# Patient Record
Sex: Female | Born: 1965
Health system: Southern US, Community
[De-identification: ages and names within clinical notes are randomized; demographics above are authoritative.]

## PROBLEM LIST (undated history)

## (undated) DIAGNOSIS — R51 Headache: Secondary | ICD-10-CM

## (undated) DIAGNOSIS — E039 Hypothyroidism, unspecified: Secondary | ICD-10-CM

## (undated) DIAGNOSIS — E7800 Pure hypercholesterolemia, unspecified: Secondary | ICD-10-CM

## (undated) DIAGNOSIS — K589 Irritable bowel syndrome without diarrhea: Secondary | ICD-10-CM

## (undated) DIAGNOSIS — G8929 Other chronic pain: Secondary | ICD-10-CM

## (undated) HISTORY — DX: Headache: R51

## (undated) HISTORY — DX: Irritable bowel syndrome without diarrhea: K58.9

## (undated) HISTORY — DX: Hypothyroidism, unspecified: E03.9

## (undated) HISTORY — DX: Other chronic pain: G89.29

## (undated) HISTORY — DX: Pure hypercholesterolemia, unspecified: E78.00

---

## 2015-10-14 HISTORY — PX: COLONOSCOPY: SHX174

## 2018-01-31 ENCOUNTER — Encounter: Payer: Self-pay | Admitting: Gastroenterology

## 2018-02-05 ENCOUNTER — Encounter: Payer: Self-pay | Admitting: Gastroenterology

## 2018-03-06 ENCOUNTER — Encounter: Payer: Self-pay | Admitting: Gastroenterology

## 2018-03-06 ENCOUNTER — Ambulatory Visit (INDEPENDENT_AMBULATORY_CARE_PROVIDER_SITE_OTHER): Payer: BLUE CROSS/BLUE SHIELD | Admitting: Gastroenterology

## 2018-03-06 VITALS — BP 106/76 | HR 80 | Ht 63.0 in | Wt 126.2 lb

## 2018-03-06 DIAGNOSIS — K589 Irritable bowel syndrome without diarrhea: Secondary | ICD-10-CM

## 2018-03-06 DIAGNOSIS — R1013 Epigastric pain: Secondary | ICD-10-CM

## 2018-03-06 DIAGNOSIS — R110 Nausea: Secondary | ICD-10-CM | POA: Diagnosis not present

## 2018-03-06 NOTE — Patient Instructions (Addendum)
If you are age 53 or older, your body mass index should be between 23-30. Your Body mass index is 22.36 kg/m. If this is out of the aforementioned range listed, please consider follow up with your Primary Care Provider.  If you are age 49 or younger, your body mass index should be between 19-25. Your Body mass index is 22.36 kg/m. If this is out of the aformentioned range listed, please consider follow up with your Primary Care Provider.    You have been scheduled for an abdominal ultrasound at [facility] (1st floor [address] ) on  03/07/18 at 10:30 am. Please arrive 15 minutes prior to your appointment for registration. Make certain not to have anything to eat or drink 6 hours prior to your appointment. Should you need to reschedule your appointment, please contact radiology at [phone]. This test typically takes about 30 minutes to perform.  You have been scheduled for an endoscopy. Please follow written instructions given to you at your visit today. If you use inhalers (even only as needed), please bring them with you on the day of your procedure. Your physician has requested that you go to www.[email or web address].[email or web address] and enter the access code given to you at your visit today. This web site gives a general overview about your procedure. However, you should still follow specific instructions given to you by our office regarding your preparation for the procedure.  Thank you,  Dr. Lynann Bologna

## 2018-03-06 NOTE — Progress Notes (Signed)
IMPRESSION and PLAN:    #1. Nausea with epigastric tenderness/pain.  (D/d PUD, GERD, gastritis, nonulcer dyspepsia, gastroparesis, r/o gallbladder or pancreatic problems), (normal CBC, CMP, amylase 01/2018) #2. H. Pylori with positive IgA  (finished prevpak x 14 days on 02/20/2018). #3. IBS with alt constipation and diarrhea (had diarrhea on omeprazole). #4. Family history of colon cancer (mom < 60), negative colonoscopy 10/2015, next due 10/2018.  PLAN: - EGD with Bx for HP and SB Bx. I have discussed the risks and benefits in detail. - Proceed with US abdomen complete. - Phenergan 25 mg po qhs for now. Wants to try phenergan rather than Zofran for now. I have discussed sedation precautions and to avoid driving while taking Phenergan. - Proceed with CT scan of the abdomen and pelvis if the above work-up is negative and continued problems. - Stop all NSAIDs and omeprazole.    HPI:    Chief Complaint:  [name] with nausea for 1 year. Just like having "morning sickness". All day, worse in AM,  Made worse with eating makes it worse.  No Vomiting. No Heartburn, fever, chills or significant weight loss.  There is no history suggestive of early satiety. Found to have positive IgA anti-H. pylori antibody and was treated with Prevpac for 14 days.  She felt better when she was on Prevpac but then again the nausea returned No definite history suggestive of gluten or lactose intolerance Has occasional epigastric discomfort but denies having any significant abdominal pain. Has been taking nonsteroidals every day which she has stopped over the last 2 weeks.  Did not affect her nausea. Has some abdominal bloating. Has history of constipation, negative colonoscopy December 2016 due to family history of colon cancer.  Had diarrhea occasionally but more so when she was on omeprazole.  Review of systems:       Past Medical History:  Diagnosis Date  .  Chronic headaches   . Elevated cholesterol   . Hypothyroidism   . IBS (irritable bowel syndrome)    constipation    Current Outpatient Medications  Medication Sig Dispense Refill  . Biotin 41324 MCG TABS Take 10,000 mcg by mouth daily.    . Multiple Vitamin (MULTIVITAMIN) tablet Take 1 tablet by mouth daily.     No current facility-administered medications for this visit.     Past Surgical History:  Procedure Laterality Date  . COLONOSCOPY  10/14/2015   Mild sigmoid diverticulosis.     Family History  Problem Relation Age of Onset  . Colon cancer Mother   . 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 Smoking status: Never Smoker  . Smokeless tobacco: Never Used  Substance Use Topics  . Alcohol use: Not Currently  . Drug use: Never    Not on File   Review of Systems: All systems reviewed and negative except where noted in HPI.    Physical Exam:     BP 106/76   Pulse 80   Ht  (1.6 m)   Wt 126 lb 4 oz (57.3 kg)   BMI 22.36 kg/m  @ GENERAL:  Alert, oriented, cooperative, not in acute distress. PSYCH: :Pleasant, normal mood and affect. HEENT:  conjunctiva pink, mucous membranes moist, neck supple without masses. No jaundice. CARDIAC:  S1 S2 normal. No murmers. PULM: Normal respiratory effort, lungs CTA bilaterally, no wheezing. ABDOMEN: Inspection: No visible peristalsis,  no abnormal pulsations, skin normal.  Palpation/percussion: Soft, epigastric tenderness but no rebound, nondistended, no rigidity, no abnormal dullness to percussion, no hepatosplenomegaly and no palpable abdominal masses.  Auscultation: Normal bowel sounds, no abdominal bruits. Rectal exam: Deferred SKIN:  turgor, no lesions seen. Musculoskeletal:  Normal muscle tone, normal strength. NEURO: Alert and oriented x 3, no focal neurologic deficits.   Data Reviewed: I have personally reviewed following labs and imaging  studies     Courtney Shrestha,MD 03/06/2018, 10:15 AM   CC Dr Sedalia Muta

## 2018-03-07 ENCOUNTER — Ambulatory Visit (HOSPITAL_BASED_OUTPATIENT_CLINIC_OR_DEPARTMENT_OTHER)
Admission: RE | Admit: 2018-03-07 | Discharge: 2018-03-07 | Disposition: A | Discharge status: Home / Self Care | Payer: BLUE CROSS/BLUE SHIELD | Source: Ambulatory Visit | Attending: Gastroenterology | Admitting: Gastroenterology

## 2018-03-07 DIAGNOSIS — R1013 Epigastric pain: Secondary | ICD-10-CM | POA: Insufficient documentation

## 2018-03-07 DIAGNOSIS — R110 Nausea: Secondary | ICD-10-CM

## 2018-03-07 DIAGNOSIS — K589 Irritable bowel syndrome without diarrhea: Secondary | ICD-10-CM | POA: Insufficient documentation

## 2018-03-08 ENCOUNTER — Telehealth: Payer: Self-pay

## 2018-03-08 ENCOUNTER — Other Ambulatory Visit: Payer: Self-pay

## 2018-03-08 DIAGNOSIS — R110 Nausea: Secondary | ICD-10-CM

## 2018-03-08 MED ORDER — PROMETHAZINE HCL 25 MG PO TABS: ORAL_TABLET | ORAL | 1 refills | Status: AC

## 2018-03-08 NOTE — Telephone Encounter (Signed)
Thanks

## 2018-03-08 NOTE — Telephone Encounter (Signed)
I gave her the results of her ultrasound.  She asked about the Phenergan.  Per your office note, I did send a rx to her pharmacy, Phenergan 25 mg po QHS #30 with 1 refill.

## 2018-03-08 NOTE — Telephone Encounter (Signed)
lmtcb 5/2 

## 2018-04-03 ENCOUNTER — Encounter: Payer: Self-pay | Admitting: Gastroenterology

## 2018-04-16 ENCOUNTER — Encounter: Payer: BLUE CROSS/BLUE SHIELD | Admitting: Gastroenterology

## 2018-10-25 ENCOUNTER — Encounter: Payer: Self-pay | Admitting: Gastroenterology

## 2019-12-15 IMAGING — US US ABDOMEN COMPLETE
1 series · 14 of 25 positions shown · non-contrast
Comparison: None.

CLINICAL DATA: Epigastric pain

EXAM:
ABDOMEN ULTRASOUND COMPLETE

[Series 1: us abdomen complete · 0.22mm/px · 14 of 99 slices shown]
[im 1/99]
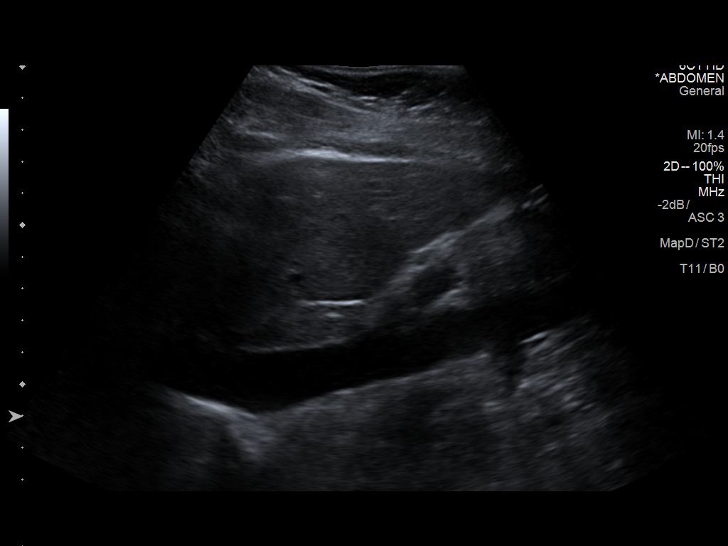
[im 9/99]
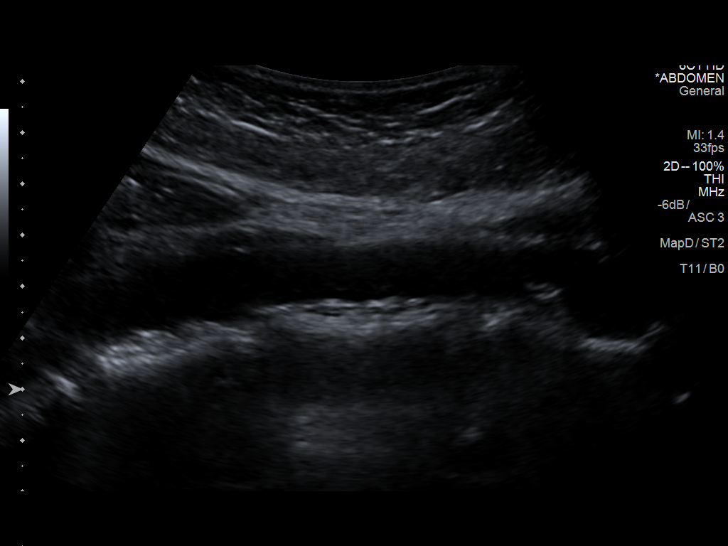
[im 17/99]
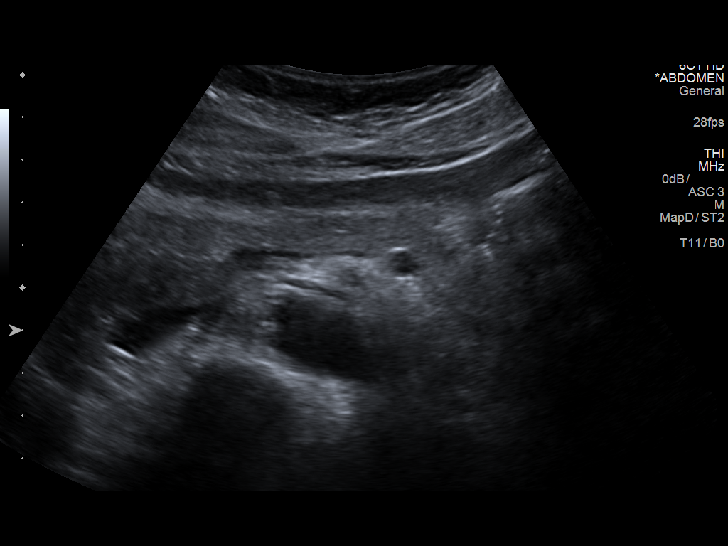
[im 25/99]
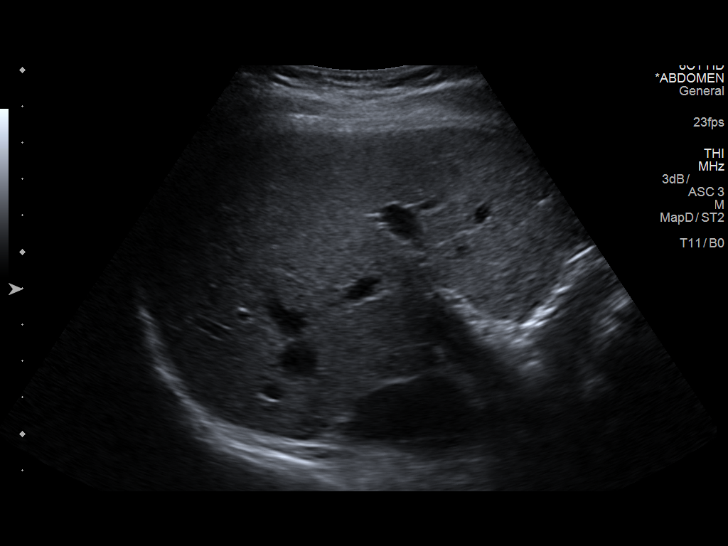
[im 33/99]
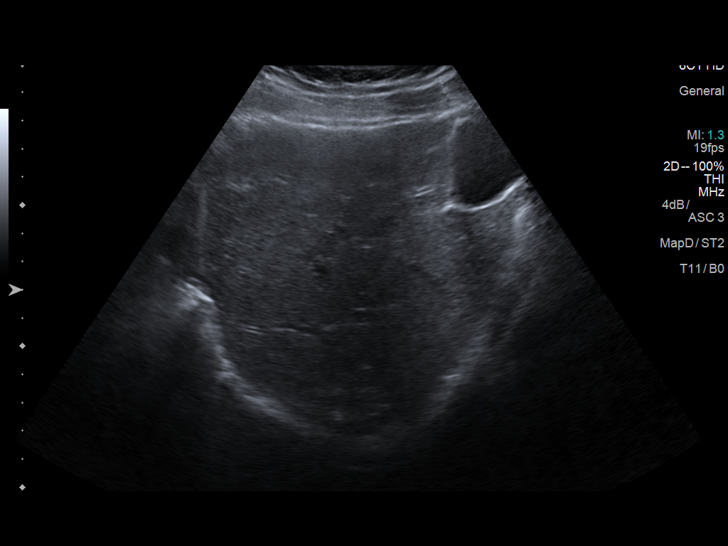
[im 37/99]
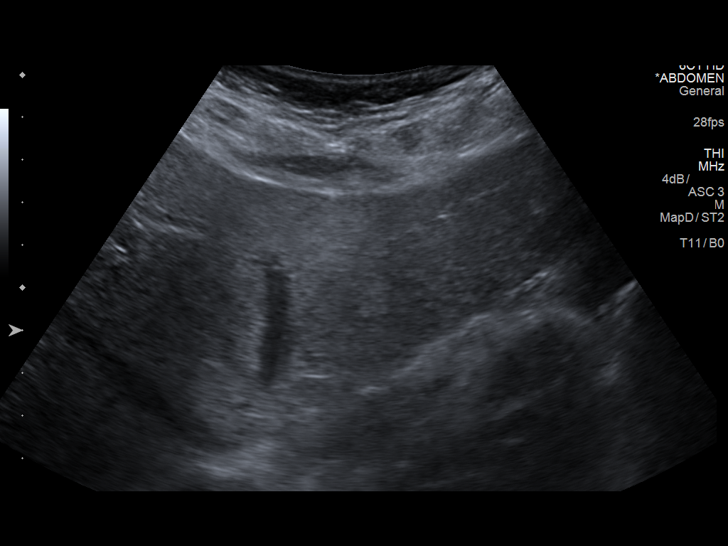
[im 45/99]
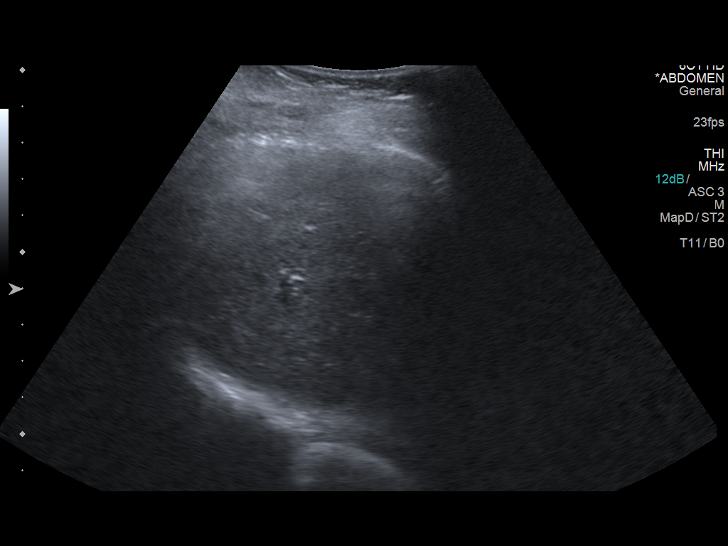
[im 54/99]
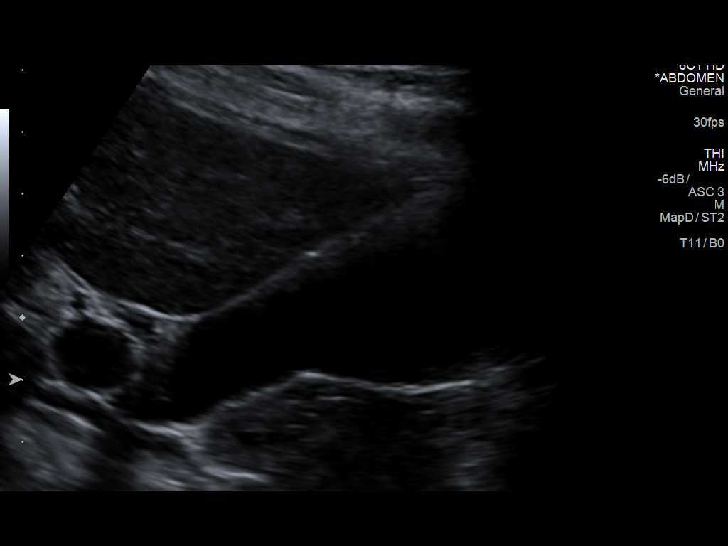
[im 62/99]
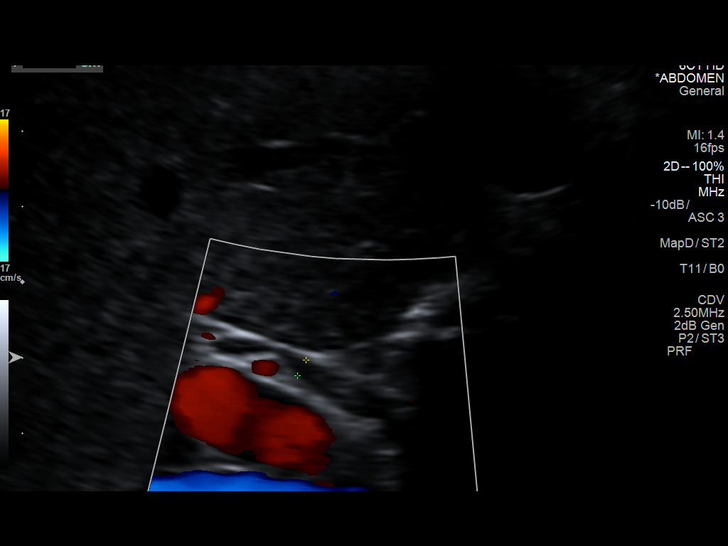
[im 66/99]
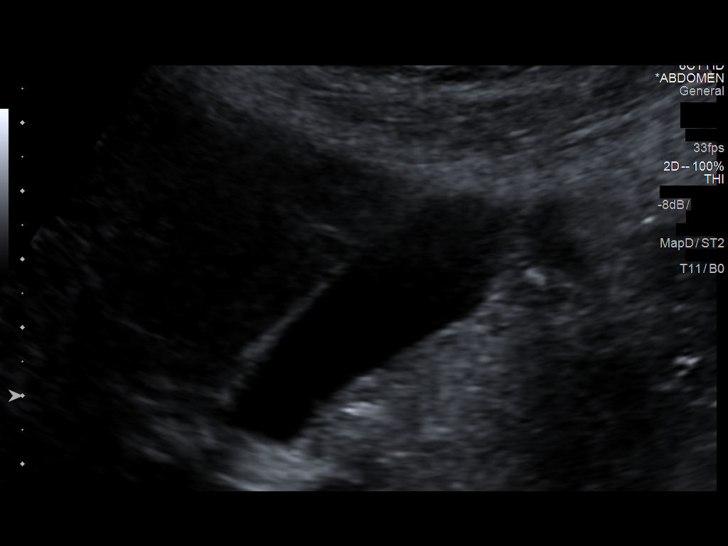
[im 74/99]
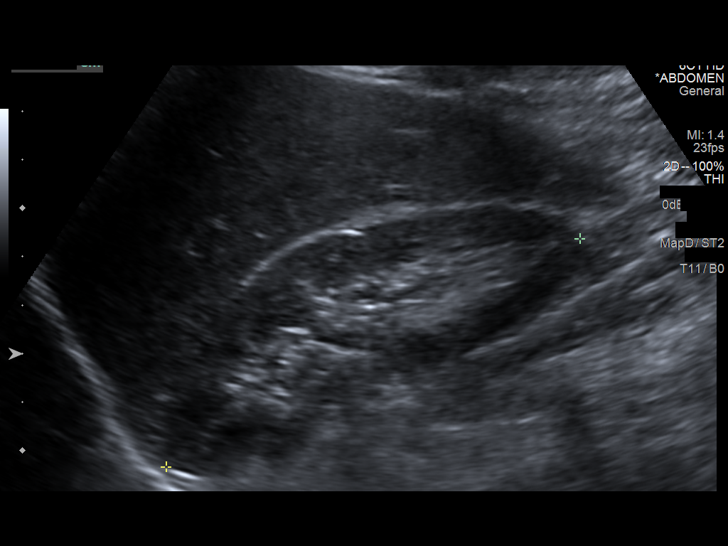
[im 82/99]
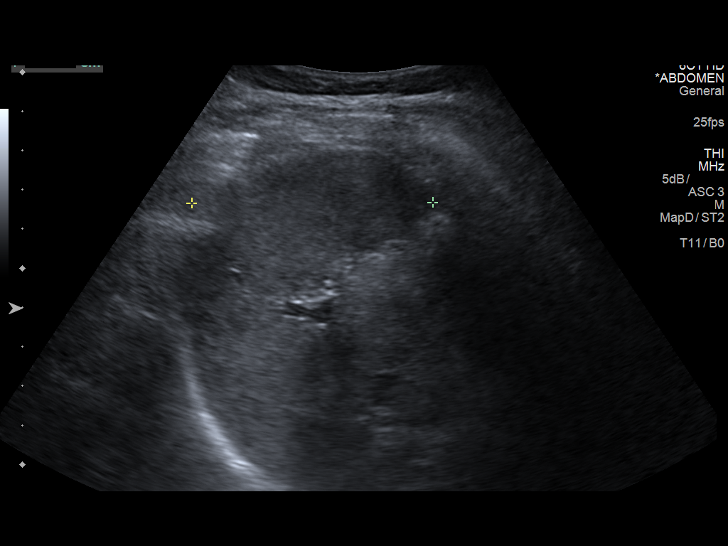
[im 90/99]
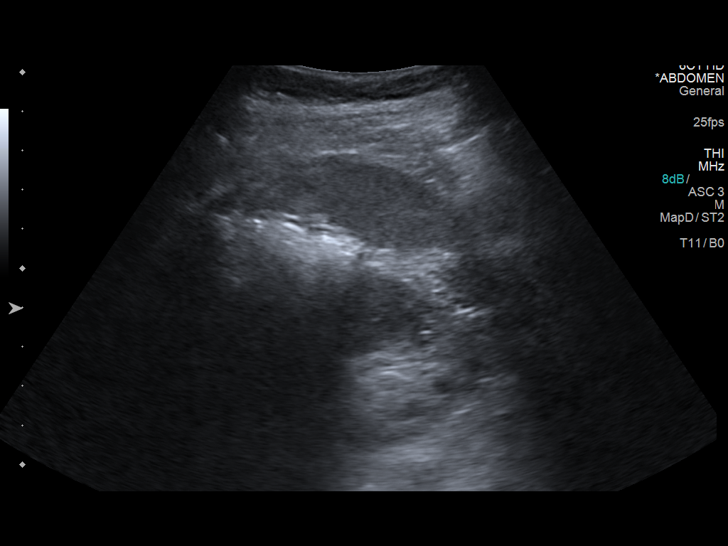
[im 99/99]
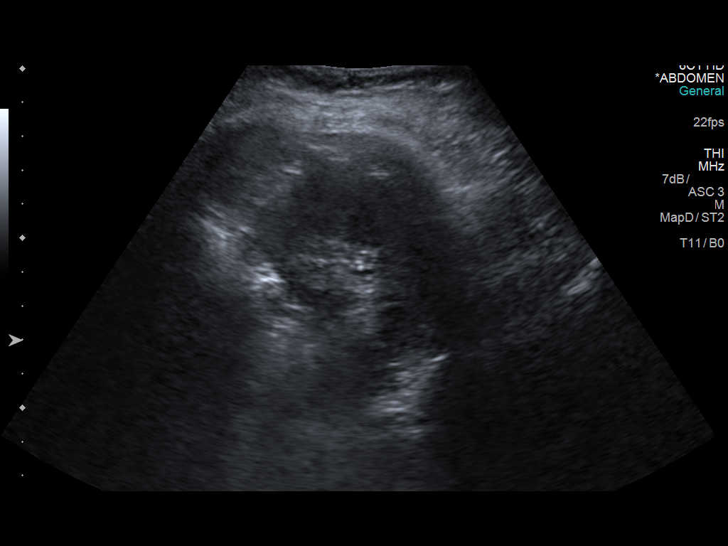

[14 of 25 positions shown; findings below may reference images not displayed]

FINDINGS: Gallbladder: No gallstones or wall thickening visualized. No
sonographic Murphy sign noted by sonographer.

Common bile duct: Diameter: 2.4 mm

Liver: No focal lesion identified. Within normal limits in
parenchymal echogenicity. Portal vein is patent on color Doppler
imaging with normal direction of blood flow towards the liver.

IVC: No abnormality visualized.

Pancreas: Visualized portion unremarkable.

Spleen: Size and appearance within normal limits.

Right Kidney: Length: 9.8 cm. Echogenicity within normal limits. No
mass or hydronephrosis visualized.

Left Kidney: Length: 10.5 cm. Echogenicity within normal limits. No
mass or hydronephrosis visualized.

Abdominal aorta: No aneurysm visualized.

Other findings: None.
IMPRESSION: 1. Normal abdominal ultrasound.
